# Patient Record
Sex: Female | Born: 1990 | Race: White | Hispanic: No | Marital: Single | Smoking: Former smoker
Health system: Southern US, Community
[De-identification: ages and names within clinical notes are randomized; demographics above are authoritative.]

## PROBLEM LIST (undated history)

## (undated) DIAGNOSIS — J45909 Unspecified asthma, uncomplicated: Secondary | ICD-10-CM

## (undated) HISTORY — DX: Unspecified asthma, uncomplicated: J45.909

---

## 2015-10-24 ENCOUNTER — Other Ambulatory Visit: Payer: Self-pay | Admitting: Family Medicine

## 2015-10-24 DIAGNOSIS — Z3401 Encounter for supervision of normal first pregnancy, first trimester: Secondary | ICD-10-CM

## 2015-10-26 ENCOUNTER — Ambulatory Visit: Admission: RE | Admit: 2015-10-26 | Payer: Medicaid Other | Source: Ambulatory Visit

## 2015-10-26 ENCOUNTER — Ambulatory Visit
Admission: RE | Admit: 2015-10-26 | Discharge: 2015-10-26 | Disposition: A | Discharge status: Home / Self Care | Payer: Medicaid Other | Source: Ambulatory Visit | Attending: Family Medicine | Admitting: Family Medicine

## 2015-10-26 DIAGNOSIS — Z3A08 8 weeks gestation of pregnancy: Secondary | ICD-10-CM | POA: Diagnosis not present

## 2015-10-26 DIAGNOSIS — Z3401 Encounter for supervision of normal first pregnancy, first trimester: Secondary | ICD-10-CM | POA: Diagnosis not present

## 2015-11-21 ENCOUNTER — Encounter: Payer: Self-pay | Admitting: *Deleted

## 2015-11-21 ENCOUNTER — Emergency Department: Payer: Medicaid Other

## 2015-11-21 ENCOUNTER — Emergency Department
Admission: EM | Admit: 2015-11-21 | Discharge: 2015-11-21 | Disposition: A | Discharge status: Home / Self Care | Payer: Medicaid Other | Attending: Emergency Medicine | Admitting: Emergency Medicine

## 2015-11-21 DIAGNOSIS — O209 Hemorrhage in early pregnancy, unspecified: Secondary | ICD-10-CM | POA: Diagnosis present

## 2015-11-21 DIAGNOSIS — B9689 Other specified bacterial agents as the cause of diseases classified elsewhere: Secondary | ICD-10-CM

## 2015-11-21 DIAGNOSIS — O2341 Unspecified infection of urinary tract in pregnancy, first trimester: Secondary | ICD-10-CM | POA: Insufficient documentation

## 2015-11-21 DIAGNOSIS — N390 Urinary tract infection, site not specified: Secondary | ICD-10-CM

## 2015-11-21 DIAGNOSIS — O23591 Infection of other part of genital tract in pregnancy, first trimester: Secondary | ICD-10-CM | POA: Diagnosis not present

## 2015-11-21 DIAGNOSIS — Z3A12 12 weeks gestation of pregnancy: Secondary | ICD-10-CM | POA: Diagnosis not present

## 2015-11-21 DIAGNOSIS — A599 Trichomoniasis, unspecified: Secondary | ICD-10-CM | POA: Insufficient documentation

## 2015-11-21 DIAGNOSIS — Z87891 Personal history of nicotine dependence: Secondary | ICD-10-CM | POA: Insufficient documentation

## 2015-11-21 DIAGNOSIS — N760 Acute vaginitis: Secondary | ICD-10-CM

## 2015-11-21 DIAGNOSIS — O200 Threatened abortion: Secondary | ICD-10-CM

## 2015-11-21 DIAGNOSIS — N898 Other specified noninflammatory disorders of vagina: Secondary | ICD-10-CM | POA: Insufficient documentation

## 2015-11-21 LAB — COMPREHENSIVE METABOLIC PANEL
ALT: 11 U/L — ABNORMAL LOW (ref 14–54)
AST: 18 U/L (ref 15–41)
Albumin: 3.7 g/dL (ref 3.5–5.0)
Alkaline Phosphatase: 43 U/L (ref 38–126)
Anion gap: 8 (ref 5–15)
BUN: 7 mg/dL (ref 6–20)
CO2: 20 mmol/L — ABNORMAL LOW (ref 22–32)
Calcium: 8.9 mg/dL (ref 8.9–10.3)
Chloride: 106 mmol/L (ref 101–111)
Creatinine, Ser: 0.5 mg/dL (ref 0.44–1.00)
GFR calc Af Amer: >60 mL/min (ref >60)
GFR calc non Af Amer: >60 mL/min (ref >60)
Glucose, Bld: 81 mg/dL (ref 65–99)
Potassium: 3.8 mmol/L (ref 3.5–5.1)
Sodium: 134 mmol/L — ABNORMAL LOW (ref 135–145)
Total Bilirubin: 0.3 mg/dL (ref 0.3–1.2)
Total Protein: 7.1 g/dL (ref 6.5–8.1)

## 2015-11-21 LAB — URINALYSIS COMPLETE WITH MICROSCOPIC (ARMC ONLY)
Bacteria, UA: NONE SEEN
Bilirubin Urine: NEGATIVE
Glucose, UA: NEGATIVE mg/dL
Ketones, ur: NEGATIVE mg/dL
Nitrite: NEGATIVE
Protein, ur: NEGATIVE mg/dL
Specific Gravity, Urine: 1.02 (ref 1.005–1.030)
pH: 7 (ref 5.0–8.0)

## 2015-11-21 LAB — CHLAMYDIA/NGC RT PCR (ARMC ONLY)
Chlamydia Tr: NOT DETECTED
N gonorrhoeae: NOT DETECTED

## 2015-11-21 LAB — CBC
HCT: 35 % (ref 35.0–47.0)
Hemoglobin: 12.3 g/dL (ref 12.0–16.0)
MCH: 32.2 pg (ref 26.0–34.0)
MCHC: 35 g/dL (ref 32.0–36.0)
MCV: 91.9 fL (ref 80.0–100.0)
Platelets: 257 10*3/uL (ref 150–440)
RBC: 3.8 MIL/uL (ref 3.80–5.20)
RDW: 12.9 % (ref 11.5–14.5)
WBC: 8.9 10*3/uL (ref 3.6–11.0)

## 2015-11-21 LAB — WET PREP, GENITAL
Clue Cells Wet Prep HPF POC: NONE SEEN
Sperm: NONE SEEN
Yeast Wet Prep HPF POC: NONE SEEN

## 2015-11-21 LAB — HCG, QUANTITATIVE, PREGNANCY: hCG, Beta Chain, Quant, S: 212434 m[IU]/mL — ABNORMAL HIGH (ref <5)

## 2015-11-21 LAB — ABO/RH: ABO/RH(D): A NEG

## 2015-11-21 LAB — POCT PREGNANCY, URINE: Preg Test, Ur: POSITIVE — AB

## 2015-11-21 LAB — ANTIBODY SCREEN: Antibody Screen: NEGATIVE

## 2015-11-21 MED ORDER — METRONIDAZOLE 500 MG PO TABS
ORAL_TABLET | ORAL | Status: AC
  Administered 2015-11-21: 2000 mg via ORAL
  Filled 2015-11-21: qty 4

## 2015-11-21 MED ORDER — METRONIDAZOLE 500 MG PO TABS
2000.0000 mg | ORAL_TABLET | Freq: Once | ORAL | Status: AC
  Administered 2015-11-21: 2000 mg via ORAL

## 2015-11-21 MED ORDER — METRONIDAZOLE 500 MG PO TABS
ORAL_TABLET | ORAL | Status: AC
  Filled 2015-11-21: qty 4

## 2015-11-21 MED ORDER — CEPHALEXIN 500 MG PO CAPS: 500.0000 mg | ORAL_CAPSULE | Freq: Two times a day (BID) | ORAL | 0 refills | Status: AC

## 2015-11-21 MED ORDER — RHO D IMMUNE GLOBULIN 1500 UNIT/2ML IJ SOSY
300.0000 ug | PREFILLED_SYRINGE | Freq: Once | INTRAMUSCULAR | Status: AC
  Administered 2015-11-21: 300 ug via INTRAMUSCULAR
  Filled 2015-11-21: qty 2

## 2015-11-21 MED ORDER — CEPHALEXIN 500 MG PO CAPS
500.0000 mg | ORAL_CAPSULE | Freq: Once | ORAL | Status: AC
  Administered 2015-11-21: 500 mg via ORAL
  Filled 2015-11-21 (×2): qty 1

## 2015-11-21 NOTE — ED Triage Notes (Signed)
Pt arrived to ED reporting dark red vaginal bleeding that began today. Pt reports cramping and small clots in blood. Pt reports being [redacted] weeks pregnant at this time with no previous complications.

## 2015-11-21 NOTE — ED Provider Notes (Signed)
[facility] Emergency Department Provider Note  ____________________________________________   First MD Initiated Contact with Patient 11/21/15 1403     (approximate)  I have reviewed the triage vital signs and the nursing notes.   HISTORY  Chief Complaint Vaginal Bleeding   HPI Misty Harrison is a 25 y.o. female who is a G1 P0 at 12 weeks was presenting with vaginal bleeding. She says that the bleeding started this morning and she has had several dime-sized clots which has been reduced to spotting. She is also having some lower abdominal cramping. Does not report any nausea or vomiting. Is taking prenatal vitamins. Says that she also has been diagnosed with Trichomonas but was not started on therapy because she was "too early in the pregnancy." [facility]. She denies any other medical problems. Denies any smoking, drinking or drugs.   History reviewed. No pertinent past medical history.  There are no active problems to display for this patient.   History reviewed. No pertinent surgical history.  Prior to Admission medications   Not on File    Allergies Review of patient's allergies indicates not on file.  History reviewed. No pertinent family history.  Social History Social History  Substance Use Topics  . Smoking status: Former Games developermoker  . Smokeless tobacco: Never Used  . Alcohol use No    Review of Systems Constitutional: No fever/chills Eyes: No visual changes. ENT: No sore throat. Cardiovascular: Denies chest pain. Respiratory: Denies shortness of breath. Gastrointestinal: No nausea, no vomiting.  No diarrhea.  No constipation. Genitourinary: Negative for dysuria. Musculoskeletal: Negative for back pain. Skin: Negative for rash. Neurological: Negative for headaches, focal weakness or numbness.  10-point ROS otherwise  negative.  ____________________________________________   PHYSICAL EXAM:  VITAL SIGNS: ED Triage Vitals  Enc Vitals Group     BP 11/21/15 1123 128/62     Pulse Rate 11/21/15 1123 80     Resp 11/21/15 1123 16     Temp 11/21/15 1123 97.9 F (36.6 C)     Temp src --      SpO2 11/21/15 1123 100 %     Weight 11/21/15 1123 122 lb 1 oz (55.4 kg)     Height 11/21/15 1123 5' (1.524 m)     Head Circumference --      Peak Flow --      Pain Score 11/21/15 1125 2     Pain Loc --      Pain Edu? --      Excl. in GC? --     Constitutional: Alert and oriented. Well appearing and in no acute distress. Eyes: Conjunctivae are normal. PERRL. EOMI. Head: Atraumatic. Nose: No congestion/rhinnorhea. Mouth/Throat: Mucous membranes are moist.   Neck: No stridor.   Cardiovascular: Normal rate, regular rhythm. Grossly normal heart sounds.   Respiratory: Normal respiratory effort.  No retractions. Lungs CTAB. Gastrointestinal: Soft And nontender. No distention. No CVA tenderness. Genitourinary: Normal external appearance. Speculum exam with a bubbling yellowish discharge. Bimanual exam with a closed cervical os. There is no cervical motion tenderness. No uterine or bilateral adnexal tenderness palpation. Musculoskeletal: No lower extremity tenderness nor edema.  No joint effusions. Neurologic:  Normal speech and language. No gross focal neurologic deficits are appreciated. No gait instability. Skin:  Skin is warm, dry and intact. No rash noted. Psychiatric: Mood and affect are normal. Speech and behavior are normal.  ____________________________________________   LABS (all labs ordered  are listed, but only abnormal results are displayed)  Labs Reviewed  WET PREP, GENITAL - Abnormal; Notable for the following:       Result Value   Trich, Wet Prep PRESENT (*)    WBC, Wet Prep HPF POC MANY (*)    All other components within normal limits  HCG, QUANTITATIVE, PREGNANCY - Abnormal; Notable for the  following:    hCG, Beta Chain, Quant, S 212,434 (*)    All other components within normal limits  COMPREHENSIVE METABOLIC PANEL - Abnormal; Notable for the following:    Sodium 134 (*)    CO2 20 (*)    ALT 11 (*)    All other components within normal limits  URINALYSIS COMPLETEWITH MICROSCOPIC ([facility] ONLY) - Abnormal; Notable for the following:    Color, Urine YELLOW (*)    APPearance CLEAR (*)    Hgb urine dipstick 1+ (*)    Leukocytes, UA TRACE (*)    Squamous Epithelial / LPF 0-5 (*)    All other components within normal limits  POCT PREGNANCY, URINE - Abnormal; Notable for the following:    Preg Test, Ur POSITIVE (*)    All other components within normal limits  CHLAMYDIA/NGC RT PCR ([facility] ONLY)  URINE CULTURE  CBC  ABO/RH  ANTIBODY SCREEN  RHOGAM INJECTION   ____________________________________________  EKG   ____________________________________________  RADIOLOGY  Bedside ultrasound with fetal heart rate of 150. ____________________________________________   PROCEDURES  Procedure(s) performed:   Procedures  Critical Care performed:   ____________________________________________   INITIAL IMPRESSION / ASSESSMENT AND PLAN / ED COURSE  Pertinent labs & imaging results that were available during my care of the patient were reviewed by me and considered in my medical decision making (see chart for details).  ----------------------------------------- 2:43 PM on 11/21/2015 -----------------------------------------  I discussed case with Dr. Tiburcio PeaHarris of the OB/GYN service recommends 2 g of Flagyl.  Says that the outpatient doctor likely wanted to wait because of the patient being in her first trimester. However, with symptomatically Trichomonas in addition to bleeding Dr. Tiburcio PeaHarris recommends treating this at this time with a one-time dose. I also discussed with the patient the need to not lift anything heavy and also for pelvic rest and no sexual  intercourse including nothing in the vagina at all. She understands this plan and is willing to comply. She will require workup because she does already lifting on the job.  Clinical Course   ----------------------------------------- 3:36 PM on 11/21/2015 -----------------------------------------  Patient not be positive for Trichomonas. Also possible mild UTI. We will advise her for the UTI as well. She [facility]. She is understanding the plan for outpatient treatment and willing to comply.  ____________________________________________   FINAL CLINICAL IMPRESSION(S) / ED DIAGNOSES  Threatened abortion. Trichomonas. UTI.    NEW MEDICATIONS STARTED DURING THIS VISIT:  New Prescriptions   No medications on file     Note:  This document was prepared using Dragon voice recognition software and may include unintentional dictation errors.    Myrna Blazeravid Matthew Schaevitz, MD 11/21/15 1537

## 2015-11-21 NOTE — ED Notes (Signed)
PT approx [redacted] weeks pregnant, dark red vaginal bleeding and cramping. No previous complications.

## 2015-11-21 NOTE — ED Notes (Signed)
Pt sent to waiting area with urine cup and instructed that urine sample was needed at this time.

## 2015-11-22 LAB — URINE CULTURE: Culture: 5000 — AB

## 2015-11-22 LAB — RHOGAM INJECTION: Unit division: 0

## 2015-12-24 ENCOUNTER — Other Ambulatory Visit: Payer: Self-pay | Admitting: Family Medicine

## 2015-12-24 DIAGNOSIS — Z3402 Encounter for supervision of normal first pregnancy, second trimester: Secondary | ICD-10-CM

## 2016-01-02 ENCOUNTER — Ambulatory Visit
Admission: RE | Admit: 2016-01-02 | Discharge: 2016-01-02 | Disposition: A | Discharge status: Home / Self Care | Payer: Medicaid Other | Source: Ambulatory Visit | Attending: Family Medicine | Admitting: Family Medicine

## 2016-01-02 DIAGNOSIS — Z3A18 18 weeks gestation of pregnancy: Secondary | ICD-10-CM | POA: Insufficient documentation

## 2016-01-02 DIAGNOSIS — Z3402 Encounter for supervision of normal first pregnancy, second trimester: Secondary | ICD-10-CM

## 2016-07-16 ENCOUNTER — Ambulatory Visit
Admission: RE | Admit: 2016-07-16 | Discharge: 2016-07-16 | Disposition: A | Discharge status: Home / Self Care | Payer: Medicaid Other | Source: Ambulatory Visit | Attending: Family Medicine | Admitting: Family Medicine

## 2016-07-16 NOTE — Lactation Note (Signed)
Lactation Consultation Note  Patient Name: Megin Consalvo [id number] Date: 07/16/2016     Maternal Data  Mom is a G1P1 who delivered [employer] 8weeks ago via c/s. Mom shared that she is transgender, but stopped taking testosterone in order to conceive over 1 year ago and does not plan to resume taking hormone until after chestfeeding is complete. Mom desires to chestfeed and establish milk supply. Mom says pumping every 2 hours with rental Lactina from [facility] or hand pump.   Feeding  Jimmey Ralph was able to latch on using a nipple shield 16mm and extract minimal amounts of milk from chest. An SNS was implemented about into the feeding because "Jimmey Ralph" became very frustrated. Most of his feedings have been done via a bottle w/ a regular flow nipple.    LATCH Score/Interventions  Jimmey Ralph had to be calmed first in mom's chest using a pacifier and we used a syringe to fill the nipple shield w/ formula. LC had to finger feed baby using SNS because baby would not stay latched onto mom's chest.                     Lactation Tools Discussed/Used  16mm nipple shield, bottle with slow flow nipple, SNS, curved tip syringe   Consult Status  Mom is to ask her provider for Rx for Reglan. Mom is also looking into Domperidone to help with supply as well as sticking to a pumping regime for the next consecutive 72hrs, to see if milk supply increases. Additionally, mom was given support group info for breastfeeding/chestfeeding groups. Mom will update LC at [facility] or via breastfeeding peer counselor in approx. 1 week.     LEXANDRA RETTKE 07/16/2016, 12:25 PM

## 2016-08-01 ENCOUNTER — Encounter: Payer: Self-pay | Admitting: *Deleted

## 2016-08-01 ENCOUNTER — Emergency Department
Admission: EM | Admit: 2016-08-01 | Discharge: 2016-08-01 | Disposition: A | Discharge status: Home / Self Care | Payer: Medicaid Other | Attending: Emergency Medicine | Admitting: Emergency Medicine

## 2016-08-01 DIAGNOSIS — H9201 Otalgia, right ear: Secondary | ICD-10-CM

## 2016-08-01 DIAGNOSIS — K0381 Cracked tooth: Secondary | ICD-10-CM | POA: Insufficient documentation

## 2016-08-01 DIAGNOSIS — S025XXB Fracture of tooth (traumatic), initial encounter for open fracture: Secondary | ICD-10-CM

## 2016-08-01 DIAGNOSIS — Z87891 Personal history of nicotine dependence: Secondary | ICD-10-CM | POA: Insufficient documentation

## 2016-08-01 MED ORDER — AMOXICILLIN 500 MG PO CAPS: 500.0000 mg | ORAL_CAPSULE | Freq: Three times a day (TID) | ORAL | 0 refills | Status: DC

## 2016-08-01 MED ORDER — FLUTICASONE PROPIONATE 50 MCG/ACT NA SUSP: 2.0000 | Freq: Every day | NASAL | 0 refills | Status: DC

## 2016-08-01 NOTE — ED Provider Notes (Signed)
[facility] Emergency Department Provider Note  ____________________________________________  Time seen: Approximately 2:07 PM  I have reviewed the triage vital signs and the nursing notes.   HISTORY  Chief Complaint Ear Pain    HPI Misty Harrison is a 26 y.o. female that presented to the emergency department with right ear painfor 3 days. Patient took Tylenol today for pain. She denies any other cold symptoms. She has no history of ear infections. She does have a broken lower tooth and has an appointment with the dentist on May 20. She denies fever, difficulty opening and closing mouth, sore throat, shortness of breath, chest pain, nausea, vomiting, abdominal pain.   History reviewed. No pertinent past medical history.  There are no active problems to display for this patient.   History reviewed. No pertinent surgical history.  Prior to Admission medications   Medication Sig Start Date End Date Taking? Authorizing Provider  amoxicillin (AMOXIL) 500 MG capsule Take 1 capsule (500 mg total) by mouth 3 (three) times daily. 08/01/16   Enid Derry, PA-C  fluticasone (FLONASE) 50 MCG/ACT nasal spray Place 2 sprays into both nostrils daily. 08/01/16 08/01/17  Enid Derry, PA-C    Allergies Patient has no known allergies.  History reviewed. No pertinent family history.  Social History Social History  Substance Use Topics  . Smoking status: Former Games developer  . Smokeless tobacco: Never Used  . Alcohol use No     Review of Systems  Constitutional: No fever/chills Cardiovascular: No chest pain. Respiratory: No cough. No SOB. Gastrointestinal: No abdominal pain.  No nausea, no vomiting.  Musculoskeletal: Negative for musculoskeletal pain. Skin: Negative for rash, abrasions, lacerations, ecchymosis. Neurological: Negative for headaches, numbness or tingling   ____________________________________________   PHYSICAL EXAM:  VITAL SIGNS: ED Triage  Vitals  Enc Vitals Group     BP 08/01/16 1355 134/72     Pulse Rate 08/01/16 1355 88     Resp 08/01/16 1355 18     Temp 08/01/16 1355 98.6 F (37 C)     Temp Source 08/01/16 1355 Oral     SpO2 08/01/16 1355 100 %     Weight 08/01/16 1354 135 lb (61.2 kg)     Height 08/01/16 1354  (1.549 m)     Head Circumference --      Peak Flow --      Pain Score 08/01/16 1354 8     Pain Loc --      Pain Edu? --      Excl. in GC? --      Constitutional: Alert and oriented. Well appearing and in no acute distress. Eyes: Conjunctivae are normal. PERRL. EOMI. Head: Atraumatic. ENT:      Ears: Tympanic membranes pearly gray with good landmarks. No discharge in the ear canal. No tenderness to palpation of the tragus or pinna.      Nose: No congestion/rhinnorhea.      Mouth/Throat: Mucous membranes are moist. Oropharynx non-erythematous. Tonsils nonenlarged. Large fracture in lower right molar. Tenderness to palpation around molar. No TMJ pain. No swelling. Neck: No stridor.  Cardiovascular: Normal rate, regular rhythm.  Good peripheral circulation. Respiratory: Normal respiratory effort without tachypnea or retractions. Lungs CTAB. Good air entry to the bases with no decreased or absent breath sounds. Gastrointestinal: Bowel sounds 4 quadrants. Soft and nontender to palpation. No guarding or rigidity. No palpable masses. No distention. No  Musculoskeletal: Full range of motion to all extremities. No gross deformities appreciated. Neurologic:  Normal speech  and language. No gross focal neurologic deficits are appreciated.  Skin:  Skin is warm, dry and intact. No rash noted.   ____________________________________________   LABS (all labs ordered are listed, but only abnormal results are displayed)  Labs Reviewed - No data to display ____________________________________________  EKG   ____________________________________________  RADIOLOGY  No results  found.  ____________________________________________    PROCEDURES  Procedure(s) performed:    Procedures    Medications - No data to display   ____________________________________________   INITIAL IMPRESSION / ASSESSMENT AND PLAN / ED COURSE  Pertinent labs & imaging results that were available during my care of the patient were reviewed by me and considered in my medical decision making (see chart for details).  Review of the [place] CSRS was performed in accordance of the [facility] prior to dispensing any controlled drugs.   Patient's diagnosis is consistent with fractured tooth. Vital signs and exam are reassuring. No evidence of ear infection. She does have fluid behind her eardrums well give her a prescription of Flonase. Patient has a large fracture in a lower right molar. I suspect that her ear pain is radiating from tooth. Patient will be discharged home with prescriptions for amoxicillin and Flonase. Patient is to follow up with dentist as directed. Patient is given ED precautions to return to the ED for any worsening or new symptoms.     ____________________________________________  FINAL CLINICAL IMPRESSION(S) / ED DIAGNOSES  Final diagnoses:  Right ear pain  Open fracture of tooth, initial encounter      NEW MEDICATIONS STARTED DURING THIS VISIT:  Discharge Medication List as of 08/01/2016  2:08 PM    START taking these medications   Details  amoxicillin (AMOXIL) 500 MG capsule Take 1 capsule (500 mg total) by mouth 3 (three) times daily., Starting Fri 08/01/2016, Print    fluticasone (FLONASE) 50 MCG/ACT nasal spray Place 2 sprays into both nostrils daily., Starting Fri 08/01/2016, Until Sat 08/01/2017, Print            This chart was dictated using voice recognition software/Dragon. Despite best efforts to proofread, errors can occur which can change the meaning. Any change was purely unintentional.    Enid Derry, PA-C 08/01/16 1536    Sharyn Creamer, MD 08/01/16 [phone]

## 2016-08-01 NOTE — ED Triage Notes (Signed)
Pt states right ear pain for 3 days, took tylenol before arrival

## 2018-04-04 ENCOUNTER — Encounter: Payer: Self-pay | Admitting: Emergency Medicine

## 2018-04-04 ENCOUNTER — Other Ambulatory Visit: Payer: Self-pay

## 2018-04-04 DIAGNOSIS — Y999 Unspecified external cause status: Secondary | ICD-10-CM | POA: Diagnosis not present

## 2018-04-04 DIAGNOSIS — Y939 Activity, unspecified: Secondary | ICD-10-CM | POA: Insufficient documentation

## 2018-04-04 DIAGNOSIS — S99812A Other specified injuries of left ankle, initial encounter: Secondary | ICD-10-CM | POA: Diagnosis present

## 2018-04-04 DIAGNOSIS — S93432A Sprain of tibiofibular ligament of left ankle, initial encounter: Secondary | ICD-10-CM | POA: Diagnosis not present

## 2018-04-04 DIAGNOSIS — X500XXA Overexertion from strenuous movement or load, initial encounter: Secondary | ICD-10-CM | POA: Diagnosis not present

## 2018-04-04 DIAGNOSIS — Z87891 Personal history of nicotine dependence: Secondary | ICD-10-CM | POA: Diagnosis not present

## 2018-04-04 DIAGNOSIS — Y929 Unspecified place or not applicable: Secondary | ICD-10-CM | POA: Insufficient documentation

## 2018-04-04 NOTE — ED Triage Notes (Signed)
Reports slipped and fell.  Reports left ankle pain.

## 2018-04-05 ENCOUNTER — Emergency Department: Payer: Managed Care, Other (non HMO)

## 2018-04-05 ENCOUNTER — Emergency Department
Admission: EM | Admit: 2018-04-05 | Discharge: 2018-04-05 | Disposition: A | Discharge status: Home / Self Care | Payer: Managed Care, Other (non HMO) | Attending: Emergency Medicine | Admitting: Emergency Medicine

## 2018-04-05 DIAGNOSIS — S93492A Sprain of other ligament of left ankle, initial encounter: Secondary | ICD-10-CM

## 2018-04-05 DIAGNOSIS — S93432A Sprain of tibiofibular ligament of left ankle, initial encounter: Secondary | ICD-10-CM | POA: Diagnosis not present

## 2018-04-05 MED ORDER — IBUPROFEN 600 MG PO TABS
600.0000 mg | ORAL_TABLET | Freq: Once | ORAL | Status: AC
  Administered 2018-04-05: 600 mg via ORAL
  Filled 2018-04-05: qty 1

## 2018-04-05 MED ORDER — IBUPROFEN 600 MG PO TABS: 600.0000 mg | ORAL_TABLET | Freq: Three times a day (TID) | ORAL | 0 refills | Status: DC | PRN

## 2018-04-05 MED ORDER — HYDROCODONE-ACETAMINOPHEN 5-325 MG PO TABS: 1.0000 | ORAL_TABLET | Freq: Four times a day (QID) | ORAL | 0 refills | Status: DC | PRN

## 2018-04-05 NOTE — Discharge Instructions (Signed)
It was a pleasure to take care of you today, and thank you for coming to our emergency department.  If you have any questions or concerns before leaving please ask the nurse to grab me and I'm more than happy to go through your aftercare instructions again.  If you were prescribed any opioid pain medication today such as Norco, Vicodin, Percocet, morphine, hydrocodone, or oxycodone please make sure you do not drive when you are taking this medication as it can alter your ability to drive safely.  If you have any concerns once you are home that you are not improving or are in fact getting worse before you can make it to your follow-up appointment, please do not hesitate to call 911 and come back for further evaluation.  Misty BrittleNeil Keshia Weare, MD   Dg Ankle Complete Left  Result Date: 04/05/2018 CLINICAL DATA:  Fall with ankle pain EXAM: LEFT ANKLE COMPLETE - 3+ VIEW COMPARISON:  None. FINDINGS: There is no evidence of fracture, dislocation, or joint effusion. There is no evidence of arthropathy or other focal bone abnormality. Soft tissues are unremarkable. IMPRESSION: Negative. Electronically Signed   By: Deatra RobinsonKevin  Herman M.D.   On: 04/05/2018 00:46

## 2018-04-05 NOTE — ED Provider Notes (Signed)
[facility] Emergency Department Provider Note  ____________________________________________   First MD Initiated Contact with Patient 04/05/18 0215     (approximate)  I have reviewed the triage vital signs and the nursing notes.   HISTORY  Chief Complaint Ankle Pain   HPI Misty Harrison is a 27 y.o. female who self presents to the emergency department with sudden onset moderate severity left ankle pain that began while she ran from outside to get inside the house while intoxicated this evening.  She rolled her ankle.  She has been able to bear weight although with difficulty.  She did not hit her head.  She did not lose consciousness.  She denies numbness or weakness.    No past medical history on file.  There are no active problems to display for this patient.   No past surgical history on file.  Prior to Admission medications   Medication Sig Start Date End Date Taking? Authorizing Provider  amoxicillin (AMOXIL) 500 MG capsule Take 1 capsule (500 mg total) by mouth 3 (three) times daily. 08/01/16   Enid DerryWagner, Ashley, PA-C  fluticasone (FLONASE) 50 MCG/ACT nasal spray Place 2 sprays into both nostrils daily. 08/01/16 08/01/17  Enid DerryWagner, Ashley, PA-C  HYDROcodone-acetaminophen (NORCO) 5-325 MG tablet Take 1 tablet by mouth every 6 (six) hours as needed for up to 7 doses for severe pain. 04/05/18   Merrily Brittleifenbark, Joseph Johns, MD  ibuprofen (ADVIL,MOTRIN) 600 MG tablet Take 1 tablet (600 mg total) by mouth every 8 (eight) hours as needed. 04/05/18   Merrily Brittleifenbark, Emani Taussig, MD    Allergies Patient has no known allergies.  No family history on file.  Social History Social History   Tobacco Use  . Smoking status: Former Games developermoker  . Smokeless tobacco: Never Used  Substance Use Topics  . Alcohol use: No  . Drug use: No    Review of Systems Constitutional: No fever/chills Cardiovascular: Denies chest pain. Respiratory: Denies shortness of breath. Gastrointestinal: No  abdominal pain.  No nausea, no vomiting.   Musculoskeletal: Positive for ankle pain Neurological: Negative for headaches   ____________________________________________   PHYSICAL EXAM:  VITAL SIGNS: ED Triage Vitals  Enc Vitals Group     BP 04/04/18 2351 122/80     Pulse Rate 04/04/18 2351 96     Resp 04/04/18 2351 16     Temp 04/04/18 2351 98.6 F (37 C)     Temp Source 04/04/18 2351 Oral     SpO2 04/04/18 2351 100 %     Weight 04/04/18 2352 148 lb (67.1 kg)     Height 04/04/18 2352 5\' 1"  (1.549 m)     Head Circumference --      Peak Flow --      Pain Score 04/04/18 2357 3     Pain Loc --      Pain Edu? --      Excl. in GC? --     Constitutional: Alert and oriented x4 pleasantly intoxicated nontoxic no diaphoresis Head: Atraumatic. Nose: No congestion/rhinnorhea. Mouth/Throat: No trismus Neck: No stridor.   Cardiovascular: Regular rate and rhythm Respiratory: Normal respiratory effort.  No retractions. MSK: No tenderness over medial malleolus or lateral malleolus or for 6 cm proximal No tenderness over navicular, midfoot, or fifth metatarsal Quite tender over the ATFL 2+ dorsalis pedis pulse Skin closed Compartments soft Patient can fire extensor hallucis longus, extensor digitorum longus, flexor hallucis longus, flexor digitorum longus, tibialis anterior, and gastrocnemius Sensation intact to light touch to sural, saphenous, deep  peroneal, superficial peroneal, and tibial nerve  Neurologic:  Normal speech and language. No gross focal neurologic deficits are appreciated.  Skin:  Skin is warm, dry and intact. No rash noted.    ____________________________________________  LABS (all labs ordered are listed, but only abnormal results are displayed)  Labs Reviewed - No data to display   __________________________________________  EKG   ____________________________________________  RADIOLOGY  X-ray of the left ankle reviewed by me with no acute  disease ____________________________________________   DIFFERENTIAL includes but not limited to  Ankle fracture, Jones fracture, pseudo-Jones fracture, ankle sprain   PROCEDURES  Procedure(s) performed: no  Procedures  Critical Care performed: no  ____________________________________________   INITIAL IMPRESSION / ASSESSMENT AND PLAN / ED COURSE  Pertinent labs & imaging results that were available during my care of the patient were reviewed by me and considered in my medical decision making (see chart for details).   As part of my medical decision making, I reviewed the following data within the electronic MEDICAL RECORD NUMBER History obtained from family if available, nursing notes, old chart and ekg, as well as notes from prior ED visits.  The patient rolled her ankle and has acute tenderness over her ATFL.  X-ray is negative.  Neurovascularly intact.  Ace wrapped and I will give her a short course of hydrocodone and Motrin for home.  Her sober husband is driving.  Strict return precautions have been given.      ____________________________________________   FINAL CLINICAL IMPRESSION(S) / ED DIAGNOSES  Final diagnoses:  Sprain of anterior talofibular ligament of left ankle, initial encounter      NEW MEDICATIONS STARTED DURING THIS VISIT:  Discharge Medication List as of 04/05/2018  2:42 AM    START taking these medications   Details  HYDROcodone-acetaminophen (NORCO) 5-325 MG tablet Take 1 tablet by mouth every 6 (six) hours as needed for up to 7 doses for severe pain., Starting Mon 04/05/2018, Print    ibuprofen (ADVIL,MOTRIN) 600 MG tablet Take 1 tablet (600 mg total) by mouth every 8 (eight) hours as needed., Starting Mon 04/05/2018, Print         Note:  This document was prepared using Dragon voice recognition software and may include unintentional dictation errors.     Merrily Brittleifenbark, Anuja Manka, MD 04/07/18 1018

## 2018-04-05 NOTE — ED Notes (Signed)
Bandage applied to left ankle.

## 2019-05-09 ENCOUNTER — Ambulatory Visit: Payer: Medicaid Other

## 2019-08-24 NOTE — Progress Notes (Deleted)
    Center, [facility]   No chief complaint on file.   HPI:      Ms. Misty Harrison is a 29 y.o. G1P0 whose LMP was No LMP recorded. Patient has had an implant., presents today for [facility] consult and IUD removal    No past medical history on file.  No past surgical history on file.  No family history on file.  Social History   Socioeconomic History  . Marital status: Single    Spouse name: Not on file  . Number of children: Not on file  . Years of education: Not on file  . Highest education level: Not on file  Occupational History  . Not on file  Tobacco Use  . Smoking status: Former Games developer  . Smokeless tobacco: Never Used  Substance and Sexual Activity  . Alcohol use: No  . Drug use: No  . Sexual activity: Yes  Other Topics Concern  . Not on file  Social History Narrative  . Not on file   Social Determinants of Health   Financial Resource Strain:   . Difficulty of Paying Living Expenses:   Food Insecurity:   . Worried About Programme researcher, broadcasting/film/video in the Last Year:   . Barista in the Last Year:   Transportation Needs:   . Freight forwarder (Medical):   Marland Kitchen Lack of Transportation (Non-Medical):   Physical Activity:   . Days of Exercise per Week:   . Minutes of Exercise per Session:   Stress:   . Feeling of Stress :   Social Connections:   . Frequency of Communication with Friends and Family:   . Frequency of Social Gatherings with Friends and Family:   . Attends Religious Services:   . Active Member of Clubs or Organizations:   . Attends Banker Meetings:   Marland Kitchen Marital Status:   Intimate Partner Violence:   . Fear of Current or Ex-Partner:   . Emotionally Abused:   Marland Kitchen Physically Abused:   . Sexually Abused:     Outpatient Medications Prior to Visit  Medication Sig Dispense Refill  . amoxicillin (AMOXIL) 500 MG capsule Take 1 capsule (500 mg total) by mouth 3 (three) times daily. 30 capsule 0  . fluticasone  (FLONASE) 50 MCG/ACT nasal spray Place 2 sprays into both nostrils daily. 16 g 0  . HYDROcodone-acetaminophen (NORCO) 5-325 MG tablet Take 1 tablet by mouth every 6 (six) hours as needed for up to 7 doses for severe pain. 7 tablet 0  . ibuprofen (ADVIL,MOTRIN) 600 MG tablet Take 1 tablet (600 mg total) by mouth every 8 (eight) hours as needed. 30 tablet 0   No facility-administered medications prior to visit.      ROS:  Review of Systems BREAST: No symptoms   OBJECTIVE:   Vitals:  There were no vitals taken for this visit.  Physical Exam  IUD Removal Strings of IUD identified and grasped.  IUD removed without problem with ring forceps.  Pt tolerated this well.  IUD noted to be intact.  Results: No results found for this or any previous visit (from the past 24 hour(s)).   Assessment/Plan: No diagnosis found.    No orders of the defined types were placed in this encounter.     No follow-ups on file.  Perian Tedder B. Sula Fetterly, PA-C 08/24/2019 2:15 PM

## 2019-08-25 ENCOUNTER — Encounter: Payer: Medicaid Other | Admitting: Obstetrics and Gynecology

## 2019-09-21 ENCOUNTER — Ambulatory Visit (INDEPENDENT_AMBULATORY_CARE_PROVIDER_SITE_OTHER): Payer: Medicaid Other | Admitting: Obstetrics and Gynecology

## 2019-09-21 ENCOUNTER — Other Ambulatory Visit: Payer: Self-pay

## 2019-09-21 ENCOUNTER — Encounter: Payer: Self-pay | Admitting: Obstetrics and Gynecology

## 2019-09-21 ENCOUNTER — Other Ambulatory Visit (HOSPITAL_COMMUNITY)
Admission: RE | Admit: 2019-09-21 | Discharge: 2019-09-21 | Disposition: A | Discharge status: Home / Self Care | Payer: Medicaid Other | Source: Ambulatory Visit | Attending: Obstetrics and Gynecology | Admitting: Obstetrics and Gynecology

## 2019-09-21 VITALS — BP 114/80 | Ht 60.0 in | Wt 148.0 lb

## 2019-09-21 DIAGNOSIS — Z30431 Encounter for routine checking of intrauterine contraceptive device: Secondary | ICD-10-CM | POA: Diagnosis not present

## 2019-09-21 DIAGNOSIS — Z113 Encounter for screening for infections with a predominantly sexual mode of transmission: Secondary | ICD-10-CM | POA: Diagnosis not present

## 2019-09-21 DIAGNOSIS — Z124 Encounter for screening for malignant neoplasm of cervix: Secondary | ICD-10-CM | POA: Diagnosis not present

## 2019-09-21 NOTE — Progress Notes (Signed)
Center, [facility]   Chief Complaint  Patient presents with  . Contraception    IUD removal due to partner feeling "poking" and pt's recurrent yeast infections, interested in Nexplanon    HPI:      Ms. Misty Harrison is a 29 y.o. G1P0 whose LMP was Patient's last menstrual period was 07/22/2019 (approximate)., presents today for NP eval of IUD; Mirena placed 4/18. Pt is amenorrheic, no dysmen. She is sex active, no bleeding/pain. Partner can feel strings now, new issue since placed 4 yrs ago, so pt concerned. Pt interested in nexplanon but really likes IUD. She had 3 yeast infections over a 4 month time frame earlier this yr, but no sx in past several months. Treated with diflucan with sx relief. No sx today. No urin sx, LBP, pelvic pain, fevers.  No recent pap/STD testing. Hx of abn pap about 4 yrs ago, never had colpo. Due for pap today. Hx of trich at that time too.    Past Medical History:  Diagnosis Date  . Asthma     Past Surgical History:  Procedure Laterality Date  . CESAREAN SECTION  2018    History reviewed. No pertinent family history.  Social History   Socioeconomic History  . Marital status: Single    Spouse name: Not on file  . Number of children: Not on file  . Years of education: Not on file  . Highest education level: Not on file  Occupational History  . Not on file  Tobacco Use  . Smoking status: Former Research scientist (life sciences)  . Smokeless tobacco: Never Used  Vaping Use  . Vaping Use: Every day  Substance and Sexual Activity  . Alcohol use: No  . Drug use: No  . Sexual activity: Yes    Birth control/protection: I.U.D.    Comment: Mirena  Other Topics Concern  . Not on file  Social History Narrative  . Not on file   Social Determinants of Health   Financial Resource Strain:   . Difficulty of Paying Living Expenses:   Food Insecurity:   . Worried About Charity fundraiser in the Last Year:   . Arboriculturist in the Last Year:     Transportation Needs:   . Film/video editor (Medical):   Marland Kitchen Lack of Transportation (Non-Medical):   Physical Activity:   . Days of Exercise per Week:   . Minutes of Exercise per Session:   Stress:   . Feeling of Stress :   Social Connections:   . Frequency of Communication with Friends and Family:   . Frequency of Social Gatherings with Friends and Family:   . Attends Religious Services:   . Active Member of Clubs or Organizations:   . Attends Archivist Meetings:   Marland Kitchen Marital Status:   Intimate Partner Violence:   . Fear of Current or Ex-Partner:   . Emotionally Abused:   Marland Kitchen Physically Abused:   . Sexually Abused:     Outpatient Medications Prior to Visit  Medication Sig Dispense Refill  . levonorgestrel (MIRENA) 20 MCG/24HR IUD by Intrauterine route. April 2018    . amoxicillin (AMOXIL) 500 MG capsule Take 1 capsule (500 mg total) by mouth 3 (three) times daily. 30 capsule 0  . fluticasone (FLONASE) 50 MCG/ACT nasal spray Place 2 sprays into both nostrils daily. 16 g 0  . HYDROcodone-acetaminophen (NORCO) 5-325 MG tablet Take 1 tablet by mouth every 6 (six) hours as needed for up to  7 doses for severe pain. 7 tablet 0  . ibuprofen (ADVIL,MOTRIN) 600 MG tablet Take 1 tablet (600 mg total) by mouth every 8 (eight) hours as needed. 30 tablet 0   No facility-administered medications prior to visit.      ROS:  Review of Systems  Constitutional: Negative for fever.  Gastrointestinal: Negative for blood in stool, constipation, diarrhea, nausea and vomiting.  Genitourinary: Negative for dyspareunia, dysuria, flank pain, frequency, hematuria, urgency, vaginal bleeding, vaginal discharge and vaginal pain.  Musculoskeletal: Negative for back pain.  Skin: Negative for rash.  BREAST: No symptoms   OBJECTIVE:   Vitals:  BP 114/80   Ht 5' (1.524 m)   Wt 148 lb (67.1 kg)   LMP 07/22/2019 (Approximate)   Breastfeeding No   BMI 28.90 kg/m   Physical Exam Vitals  reviewed.  Constitutional:      Appearance: She is well-developed.  Pulmonary:     Effort: Pulmonary effort is normal.  Genitourinary:    General: Normal vulva.     Pubic Area: No rash.      Labia:        Right: No rash, tenderness or lesion.        Left: No rash, tenderness or lesion.      Vagina: Normal. No vaginal discharge, erythema or tenderness.     Cervix: Normal.     Uterus: Normal. Not enlarged and not tender.      Adnexa: Right adnexa normal and left adnexa normal.       Right: No mass or tenderness.         Left: No mass or tenderness.       Comments: IUD STRINGS IN CX OS; TRIMMED ABOUT 3/4 IN.  Musculoskeletal:        General: Normal range of motion.     Cervical back: Normal range of motion.  Skin:    General: Skin is warm and dry.  Neurological:     General: No focal deficit present.     Mental Status: She is alert and oriented to person, place, and time.  Psychiatric:        Mood and Affect: Mood normal.        Behavior: Behavior normal.        Thought Content: Thought content normal.        Judgment: Judgment normal.     Assessment/Plan: Encounter for routine checking of intrauterine contraceptive device (IUD)--IUD strings in cx os. Pt likes IUD so trimmed strings. F/u if partner can still feel them. If so, pt will then change to nexplanon. Discussed pros/cons/placement of nexplanon.   Cervical cancer screening - Plan: Cytology - PAP; repeat pap today. Hx of abn in past. Never had f/u.   Screening for STD (sexually transmitted disease) - Plan: Cytology - PAP    Return if symptoms worsen or fail to improve.  Misty Hedgecock B. Aaisha Sliter, PA-C 09/21/2019 3:59 PM

## 2019-09-21 NOTE — Patient Instructions (Signed)
I value your feedback and entrusting us with your care. If you get a [facility] patient survey, I would appreciate you taking the time to let us know about your experience today. Thank you!  As of March 17, 2019, your lab results will be released to your MyChart immediately, before I even have a chance to see them. Please give me time to review them and contact you if there are any abnormalities. Thank you for your patience.  

## 2019-09-26 LAB — CYTOLOGY - PAP
Chlamydia: NEGATIVE
Comment: NEGATIVE
Comment: NORMAL
Diagnosis: NEGATIVE
Neisseria Gonorrhea: NEGATIVE

## 2020-04-22 ENCOUNTER — Other Ambulatory Visit: Payer: Self-pay

## 2020-04-22 ENCOUNTER — Emergency Department: Payer: Medicaid Other

## 2020-04-22 ENCOUNTER — Emergency Department
Admission: EM | Admit: 2020-04-22 | Discharge: 2020-04-22 | Disposition: A | Discharge status: Home / Self Care | Payer: Medicaid Other | Attending: Emergency Medicine | Admitting: Emergency Medicine

## 2020-04-22 DIAGNOSIS — J45909 Unspecified asthma, uncomplicated: Secondary | ICD-10-CM | POA: Insufficient documentation

## 2020-04-22 DIAGNOSIS — M25561 Pain in right knee: Secondary | ICD-10-CM

## 2020-04-22 DIAGNOSIS — Z87891 Personal history of nicotine dependence: Secondary | ICD-10-CM | POA: Diagnosis not present

## 2020-04-22 DIAGNOSIS — M7651 Patellar tendinitis, right knee: Secondary | ICD-10-CM | POA: Diagnosis not present

## 2020-04-22 DIAGNOSIS — M25569 Pain in unspecified knee: Secondary | ICD-10-CM | POA: Diagnosis present

## 2020-04-22 MED ORDER — KETOROLAC TROMETHAMINE 30 MG/ML IJ SOLN
30.0000 mg | Freq: Once | INTRAMUSCULAR | Status: AC
  Administered 2020-04-22: 30 mg via INTRAMUSCULAR
  Filled 2020-04-22: qty 1

## 2020-04-22 MED ORDER — IBUPROFEN 600 MG PO TABS: 600.0000 mg | ORAL_TABLET | Freq: Four times a day (QID) | ORAL | 0 refills | Status: DC | PRN

## 2020-04-22 NOTE — ED Provider Notes (Signed)
[facility] Emergency Department Provider Note  ____________________________________________  Time seen: Approximately 2:14 PM  I have reviewed the triage vital signs and the nursing notes.   HISTORY  Chief Complaint Knee Pain (Pt with knee problems x 1 year, worsening pain yesterday. )    HPI Misty Harrison is a 30 y.o. female that presents to the emergency department for evaluation of acute on chronic knee pain.  Patient states that she has had knee pain for the last year.  It has been worse over the last 2 weeks.  Pain is worse when she gets up in the morning and after she has been doing a lot of activity.  Pain is to the front of her knee just below her kneecap.  She has not had any trauma she is ambulatory.  She has a 37-year-old that she takes care of at home.  She works as a Civil Service fast streamer.  Past Medical History:  Diagnosis Date  . Asthma     There are no problems to display for this patient.   Past Surgical History:  Procedure Laterality Date  . CESAREAN SECTION  2018    Prior to Admission medications   Medication Sig Start Date End Date Taking? Authorizing Provider  ibuprofen (ADVIL) 600 MG tablet Take 1 tablet (600 mg total) by mouth every 6 (six) hours as needed. 04/22/20  Yes Enid Derry, PA-C  levonorgestrel (MIRENA) 20 MCG/24HR IUD by Intrauterine route. April 2018    [provider]    Allergies Patient has no known allergies.  No family history on file.  Social History Social History   Tobacco Use  . Smoking status: Former Games developer  . Smokeless tobacco: Never Used  Vaping Use  . Vaping Use: Every day  Substance Use Topics  . Alcohol use: No  . Drug use: No     Review of Systems  Constitutional: No fever/chills Cardiovascular: No chest pain. Respiratory: No SOB. Gastrointestinal: No abdominal pain.  No vomiting.  Musculoskeletal: Positive for knee pain.  Skin: Negative for rash, abrasions, lacerations,  ecchymosis. Neurological: Negative for headaches, numbness or tingling   ____________________________________________   PHYSICAL EXAM:  VITAL SIGNS: ED Triage Vitals  Enc Vitals Group     BP 04/22/20 1308 126/79     Pulse Rate 04/22/20 1308 87     Resp 04/22/20 1308 16     Temp 04/22/20 1308 98 F (36.7 C)     Temp Source 04/22/20 1308 Oral     SpO2 04/22/20 1308 100 %     Weight 04/22/20 1309 125 lb (56.7 kg)     Height 04/22/20 1309 5\' 1"  (1.549 m)     Head Circumference --      Peak Flow --      Pain Score 04/22/20 1308 7     Pain Loc --      Pain Edu? --      Excl. in GC? --      Constitutional: Alert and oriented. Well appearing and in no acute distress. Eyes: Conjunctivae are normal. PERRL. EOMI. Head: Atraumatic. ENT:      Ears:      Nose: No congestion/rhinnorhea.      Mouth/Throat: Mucous membranes are moist.  Neck: No stridor.   Cardiovascular: Normal rate, regular rhythm.  Good peripheral circulation. Respiratory: Normal respiratory effort without tachypnea or retractions. Lungs CTAB. Good air entry to the bases with no decreased or absent breath sounds. Musculoskeletal: Full range of motion to all extremities.  No gross deformities appreciated.  Tenderness to palpation to patellar tendon. No rash or swelling. Full range of motion of right knee.  Normal gait.  Neurologic:  Normal speech and language. No gross focal neurologic deficits are appreciated.  Skin:  Skin is warm, dry and intact. No rash noted. Psychiatric: Mood and affect are normal. Speech and behavior are normal. Patient exhibits appropriate insight and judgement.   ____________________________________________   LABS (all labs ordered are listed, but only abnormal results are displayed)  Labs Reviewed - No data to display ____________________________________________  EKG   ____________________________________________  RADIOLOGY Lexine Baton, personally viewed and evaluated these  images (plain radiographs) as part of my medical decision making, as well as reviewing the written report by the radiologist.  DG Knee Complete 4 Views Right  Result Date: 04/22/2020 CLINICAL DATA:  c/o right knee pain x 1 year, worsening pain since yesterday. Pt states it hurts to bend it and hurts to bear weight. Pt is ambulatory. The EXAM: RIGHT KNEE - COMPLETE 4+ VIEW COMPARISON:  None. FINDINGS: No evidence of fracture, dislocation, or joint effusion. No evidence of arthropathy or other focal bone abnormality. Soft tissues are unremarkable. IMPRESSION: Negative. Electronically Signed   By: Emmaline Kluver M.D.   On: 04/22/2020 13:42    ____________________________________________    PROCEDURES  Procedure(s) performed:    Procedures    Medications  ketorolac (TORADOL) 30 MG/ML injection 30 mg (has no administration in time range)     ____________________________________________   INITIAL IMPRESSION / ASSESSMENT AND PLAN / ED COURSE  Pertinent labs & imaging results that were available during my care of the patient were reviewed by me and considered in my medical decision making (see chart for details).  Review of the  [place]RS was performed in accordance of the [place] prior to dispensing any controlled drugs.     Patient presented to the emergency department for evaluation of knee pain.  Vital signs and exam reassuring.  Exam is consistent with patellar tendinitis.  She was given a dose of IM Toradol.  Knee sleeve was placed.  Crutches were given.  Patient will be discharged home with prescriptions for Motrin. Patient is to follow up with orthopedics as directed. Patient is given ED precautions to return to the ED for any worsening or new symptoms.  Misty Harrison was evaluated in Emergency Department on 04/22/2020 for the symptoms described in the history of present illness. She was evaluated in the context of the global COVID-19 pandemic, which necessitated consideration  that the patient might be at risk for infection with the SARS-CoV-2 virus that causes COVID-19. Institutional protocols and algorithms that pertain to the evaluation of patients at risk for COVID-19 are in a state of rapid change based on information released by regulatory bodies including the CDC and federal and state organizations. These policies and algorithms were followed during the patient's care in the ED.   ____________________________________________  FINAL CLINICAL IMPRESSION(S) / ED DIAGNOSES  Final diagnoses:  Acute pain of right knee  Patellar tendinitis of right knee      NEW MEDICATIONS STARTED DURING THIS VISIT:  ED Discharge Orders         Ordered    ibuprofen (ADVIL) 600 MG tablet  Every 6 hours PRN        04/22/20 1427              This chart was dictated using voice recognition software/Dragon. Despite best efforts to proofread, errors can occur which  can change the meaning. Any change was purely unintentional.    Enid Derry, PA-C 04/22/20 1447    Chesley Noon, MD 04/22/20 [phone]

## 2020-04-22 NOTE — ED Triage Notes (Signed)
Pt with c/o right knee pain x 1 year, worsening pain since yesterday. Pt states it hurts to bend it and hurts to bear weight. Pt took ibuprofen 400 mg today. Pt in NAD. Pt walked in to ER and then sat in wheelchair. Pt states pain is mostly below kneecap.

## 2020-05-16 ENCOUNTER — Other Ambulatory Visit: Payer: Self-pay | Admitting: Physician Assistant

## 2020-05-16 DIAGNOSIS — M25561 Pain in right knee: Secondary | ICD-10-CM

## 2020-05-23 ENCOUNTER — Ambulatory Visit: Payer: Medicaid Other

## 2020-05-29 ENCOUNTER — Ambulatory Visit: Payer: Medicaid Other

## 2020-11-27 ENCOUNTER — Emergency Department (HOSPITAL_COMMUNITY)
Admission: EM | Admit: 2020-11-27 | Discharge: 2020-11-28 | Disposition: A | Discharge status: Home / Self Care | Payer: Medicaid Other | Attending: Emergency Medicine | Admitting: Emergency Medicine

## 2020-11-27 DIAGNOSIS — M545 Low back pain, unspecified: Secondary | ICD-10-CM | POA: Insufficient documentation

## 2020-11-27 DIAGNOSIS — R400 Somnolence: Secondary | ICD-10-CM | POA: Insufficient documentation

## 2020-11-27 DIAGNOSIS — Z5321 Procedure and treatment not carried out due to patient leaving prior to being seen by health care provider: Secondary | ICD-10-CM | POA: Diagnosis not present

## 2020-11-27 DIAGNOSIS — M539 Dorsopathy, unspecified: Secondary | ICD-10-CM | POA: Insufficient documentation

## 2020-11-27 DIAGNOSIS — M542 Cervicalgia: Secondary | ICD-10-CM
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
## 2020-11-28 ENCOUNTER — Other Ambulatory Visit: Payer: Self-pay

## 2020-11-28 ENCOUNTER — Emergency Department (HOSPITAL_COMMUNITY): Payer: Medicaid Other

## 2020-11-28 LAB — PREGNANCY, URINE: Preg Test, Ur: NEGATIVE

## 2020-11-28 MED ORDER — METHOCARBAMOL 500 MG PO TABS: 500.0000 mg | ORAL_TABLET | Freq: Two times a day (BID) | ORAL | 0 refills | Status: AC

## 2020-11-28 MED ORDER — NAPROXEN 500 MG PO TABS: 500.0000 mg | ORAL_TABLET | Freq: Two times a day (BID) | ORAL | 0 refills | Status: AC

## 2020-11-28 MED ORDER — METHOCARBAMOL 500 MG PO TABS
1000.0000 mg | ORAL_TABLET | Freq: Once | ORAL | Status: AC
  Administered 2020-11-28: 1000 mg via ORAL
  Filled 2020-11-28: qty 2

## 2020-11-28 MED ORDER — KETOROLAC TROMETHAMINE 60 MG/2ML IM SOLN
60.0000 mg | Freq: Once | INTRAMUSCULAR | Status: AC
  Administered 2020-11-28: 60 mg via INTRAMUSCULAR
  Filled 2020-11-28: qty 2
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
## 2020-11-28 NOTE — ED Notes (Signed)
Pt notified again we need urine sample. Unable to give sample at this time.

## 2020-11-28 NOTE — Discharge Instructions (Addendum)

## 2020-11-28 NOTE — ED Notes (Signed)
Patient transported to radiology
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
## 2022-05-02 ENCOUNTER — Emergency Department: Payer: Medicaid Other

## 2022-05-02 ENCOUNTER — Other Ambulatory Visit: Payer: Self-pay

## 2022-05-02 ENCOUNTER — Emergency Department
Admission: EM | Admit: 2022-05-02 | Discharge: 2022-05-02 | Disposition: A | Discharge status: Home / Self Care | Payer: Medicaid Other | Attending: Emergency Medicine | Admitting: Emergency Medicine

## 2022-05-02 DIAGNOSIS — J988 Other specified respiratory disorders: Secondary | ICD-10-CM | POA: Diagnosis not present

## 2022-05-02 DIAGNOSIS — Z1152 Encounter for screening for COVID-19: Secondary | ICD-10-CM | POA: Diagnosis not present

## 2022-05-02 DIAGNOSIS — B9789 Other viral agents as the cause of diseases classified elsewhere: Secondary | ICD-10-CM | POA: Diagnosis not present

## 2022-05-02 DIAGNOSIS — J40 Bronchitis, not specified as acute or chronic: Secondary | ICD-10-CM | POA: Insufficient documentation

## 2022-05-02 DIAGNOSIS — R509 Fever, unspecified: Secondary | ICD-10-CM | POA: Diagnosis present

## 2022-05-02 LAB — RESP PANEL BY RT-PCR (RSV, FLU A&B, COVID)  RVPGX2
Influenza A by PCR: NEGATIVE
Influenza B by PCR: NEGATIVE
Resp Syncytial Virus by PCR: NEGATIVE
SARS Coronavirus 2 by RT PCR: NEGATIVE

## 2022-05-02 LAB — CBC WITH DIFFERENTIAL/PLATELET
Abs Immature Granulocytes: 0.02 10*3/uL (ref 0.00–0.07)
Basophils Absolute: 0.1 10*3/uL (ref 0.0–0.1)
Basophils Relative: 1 %
Eosinophils Absolute: 0.5 10*3/uL (ref 0.0–0.5)
Eosinophils Relative: 5 %
HCT: 39.9 % (ref 36.0–46.0)
Hemoglobin: 12.6 g/dL (ref 12.0–15.0)
Immature Granulocytes: 0 %
Lymphocytes Relative: 25 %
Lymphs Abs: 2.2 10*3/uL (ref 0.7–4.0)
MCH: 26.3 pg (ref 26.0–34.0)
MCHC: 31.6 g/dL (ref 30.0–36.0)
MCV: 83.3 fL (ref 80.0–100.0)
Monocytes Absolute: 0.7 10*3/uL (ref 0.1–1.0)
Monocytes Relative: 8 %
Neutro Abs: 5.4 10*3/uL (ref 1.7–7.7)
Neutrophils Relative %: 61 %
Platelets: 268 10*3/uL (ref 150–400)
RBC: 4.79 MIL/uL (ref 3.87–5.11)
RDW: 15.1 % (ref 11.5–15.5)
WBC: 8.7 10*3/uL (ref 4.0–10.5)
nRBC: 0 % (ref 0.0–0.2)

## 2022-05-02 LAB — BASIC METABOLIC PANEL
Anion gap: 13 (ref 5–15)
BUN: 6 mg/dL (ref 6–20)
CO2: 19 mmol/L — ABNORMAL LOW (ref 22–32)
Calcium: 9.4 mg/dL (ref 8.9–10.3)
Chloride: 106 mmol/L (ref 98–111)
Creatinine, Ser: 0.78 mg/dL (ref 0.44–1.00)
GFR, Estimated: >60 mL/min (ref >60)
Glucose, Bld: 97 mg/dL (ref 70–99)
Potassium: 4 mmol/L (ref 3.5–5.1)
Sodium: 138 mmol/L (ref 135–145)

## 2022-05-02 NOTE — ED Provider Notes (Signed)
[facility] Provider Note    Event Date/Time   First MD Initiated Contact with Patient 05/02/22 1010     (approximate)   History   Fever   HPI  Misty Harrison is a 32 y.o. female who presents today for evaluation of cough, runny nose, and bodyaches for the past week.  She reports that she has mild blood-streaked sputum.  She reports that her son is sick with similar symptoms.  She has not had any chest pain or shortness of breath.  No lower extremity swelling or pain.  No nausea or vomiting.  No abdominal pain.  No personal or family history of PE or DVT.  No recent travel, periods of immobilization, surgeries, or hospitalizations.  There are no problems to display for this patient.         Physical Exam   Triage Vital Signs: ED Triage Vitals  Enc Vitals Group     BP 05/02/22 0951 130/81     Pulse Rate 05/02/22 0951 95     Resp 05/02/22 0951 18     Temp 05/02/22 0951 98 F (36.7 C)     Temp Source 05/02/22 0946 Oral     SpO2 05/02/22 0951 98 %     Weight --      Height --      Head Circumference --      Peak Flow --      Pain Score 05/02/22 0943 0     Pain Loc --      Pain Edu? --      Excl. in [place]? --     Most recent vital signs: Vitals:   05/02/22 0951  BP: 130/81  Pulse: 95  Resp: 18  Temp: 98 F (36.7 C)  SpO2: 98%    Physical Exam Vitals and nursing note reviewed.  Constitutional:      General: Awake and alert. No acute distress.    Appearance: Normal appearance. The patient is normal weight.  HENT:     Head: Normocephalic and atraumatic.     Mouth: Mucous membranes are moist.  Eyes:     General: PERRL. Normal EOMs        Right eye: No discharge.        Left eye: No discharge.     Conjunctiva/sclera: Conjunctivae normal.  Cardiovascular:     Rate and Rhythm: Normal rate and regular rhythm.     Pulses: Normal pulses.     Heart sounds: Normal heart sounds Pulmonary:     Effort: Pulmonary effort is normal. No  respiratory distress.     Breath sounds: Normal breath sounds.  Abdominal:     Abdomen is soft. There is no abdominal tenderness. No rebound or guarding. No distention. Musculoskeletal:        General: No swelling. Normal range of motion.     Cervical back: Normal range of motion and neck supple.  Skin:    General: Skin is warm and dry.     Capillary Refill: Capillary refill takes less than 2 seconds.     Findings: No rash.  Neurological:     Mental Status: The patient is awake and alert.      ED Results / Procedures / Treatments   Labs (all labs ordered are listed, but only abnormal results are displayed) Labs Reviewed  BASIC METABOLIC PANEL - Abnormal; Notable for the following components:      Result Value   CO2 19 (*)    All  other components within normal limits  RESP PANEL BY RT-PCR (RSV, FLU A&B, COVID)  RVPGX2  CBC WITH DIFFERENTIAL/PLATELET     EKG     RADIOLOGY I independently reviewed and interpreted imaging and agree with radiologists findings.     PROCEDURES:  Critical Care performed:   Procedures   MEDICATIONS ORDERED IN ED: Medications - No data to display   IMPRESSION / MDM / [place] / ED COURSE  I reviewed the triage vital signs and the nursing notes.   Differential diagnosis includes, but is not limited to, bronchitis, pneumonia, COVID, influenza, RSV, less likely PE.  Patient is awake and alert, hemodynamically stable and afebrile.  She has no chest pain, shortness of breath, pleurisy, clinical signs or symptoms of DVT, tachycardia, hypoxia, personal or family history of PE DVT, periods of immobilization, recent travel, or exogenous hormone use to suggest PE as a source of her symptoms today.  Labs are overall reassuring.  Swab is negative for COVID/flu/RSV.  X-ray is suggestive of bronchitis.  We discussed this finding and return precautions.  Also discussed importance of close outpatient follow-up.  Patient or stands and agrees  with plan.  She was discharged in stable condition.   Patient's presentation is most consistent with acute complicated illness / injury requiring diagnostic workup.      FINAL CLINICAL IMPRESSION(S) / ED DIAGNOSES   Final diagnoses:  Viral respiratory illness  Bronchitis     Rx / DC Orders   ED Discharge Orders     None        Note:  This document was prepared using Dragon voice recognition software and may include unintentional dictation errors.   Emeline Gins 05/02/22 1201    Harvest Dark, MD 05/02/22 1557

## 2022-05-02 NOTE — Discharge Instructions (Signed)
Your blood tests are normal and your swab is negative for COVID and flu.  Your x-ray shows findings suggestive of bronchitis.  Please return for any new, worsening, or change in symptoms or other concerns.  It was a pleasure caring for you today.

## 2022-05-02 NOTE — ED Triage Notes (Signed)
Pt comes with c/o fever and coughing up blood. Pt states this all started few days ago.

## 2022-05-02 NOTE — ED Notes (Signed)
Pt discharge to home. Pt VSS, GCS 15, NAD. Pt verbalized understanding of discharge instructions with no additional questions at this time.  

## 2022-05-02 NOTE — ED Notes (Signed)
Pt arrives with complaint of coughing up blood x2 days and a fever last night. Pt states she is just coughing up bloody mucus. Pt denies hx of this. Pt endorses CP but denies SOB.

## 2024-03-29 ENCOUNTER — Emergency Department
Admission: EM | Admit: 2024-03-29 | Discharge: 2024-03-29 | Disposition: A | Discharge status: Home / Self Care | Attending: Emergency Medicine | Admitting: Emergency Medicine

## 2024-03-29 ENCOUNTER — Emergency Department

## 2024-03-29 ENCOUNTER — Other Ambulatory Visit: Payer: Self-pay

## 2024-03-29 DIAGNOSIS — J45909 Unspecified asthma, uncomplicated: Secondary | ICD-10-CM | POA: Diagnosis not present

## 2024-03-29 DIAGNOSIS — M545 Low back pain, unspecified: Secondary | ICD-10-CM | POA: Diagnosis present

## 2024-03-29 DIAGNOSIS — M25511 Pain in right shoulder: Secondary | ICD-10-CM | POA: Diagnosis not present

## 2024-03-29 DIAGNOSIS — Y92410 Unspecified street and highway as the place of occurrence of the external cause: Secondary | ICD-10-CM | POA: Diagnosis not present

## 2024-03-29 MED ORDER — ACETAMINOPHEN 500 MG PO TABS
1000.0000 mg | ORAL_TABLET | Freq: Once | ORAL | Status: AC
  Administered 2024-03-29: 1000 mg via ORAL
  Filled 2024-03-29: qty 2

## 2024-03-29 MED ORDER — CYCLOBENZAPRINE HCL 5 MG PO TABS: 5.0000 mg | ORAL_TABLET | Freq: Three times a day (TID) | ORAL | 0 refills | Status: AC | PRN

## 2024-03-29 MED ORDER — MELOXICAM 15 MG PO TABS: 15.0000 mg | ORAL_TABLET | Freq: Every day | ORAL | 0 refills | Status: AC

## 2024-03-29 MED ORDER — LIDOCAINE 5 % EX PTCH
2.0000 | MEDICATED_PATCH | CUTANEOUS | Status: DC
  Administered 2024-03-29: 2 via TRANSDERMAL
  Filled 2024-03-29: qty 2
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
